# Patient Record
Sex: Female | Born: 1985 | Race: White | Hispanic: No | Marital: Single | Smoking: Never smoker
Health system: Southern US, Community
[De-identification: ages and names within clinical notes are randomized; demographics above are authoritative.]

## PROBLEM LIST (undated history)

## (undated) DIAGNOSIS — F419 Anxiety disorder, unspecified: Secondary | ICD-10-CM

## (undated) DIAGNOSIS — F1910 Other psychoactive substance abuse, uncomplicated: Secondary | ICD-10-CM

## (undated) DIAGNOSIS — G43909 Migraine, unspecified, not intractable, without status migrainosus: Secondary | ICD-10-CM

## (undated) HISTORY — PX: TONSILLECTOMY AND ADENOIDECTOMY: SUR1326

## (undated) HISTORY — DX: Migraine, unspecified, not intractable, without status migrainosus: G43.909

## (undated) HISTORY — DX: Anxiety disorder, unspecified: F41.9

## (undated) HISTORY — DX: Other psychoactive substance abuse, uncomplicated: F19.10

---

## 2012-12-01 ENCOUNTER — Ambulatory Visit (INDEPENDENT_AMBULATORY_CARE_PROVIDER_SITE_OTHER): Payer: Managed Care, Other (non HMO) | Admitting: Family Medicine

## 2012-12-01 VITALS — BP 116/85 | HR 93 | Temp 98.9°F | Resp 24 | Ht 68.0 in | Wt 147.0 lb

## 2012-12-01 DIAGNOSIS — F410 Panic disorder [episodic paroxysmal anxiety] without agoraphobia: Secondary | ICD-10-CM

## 2012-12-01 DIAGNOSIS — F1290 Cannabis use, unspecified, uncomplicated: Secondary | ICD-10-CM

## 2012-12-01 DIAGNOSIS — F411 Generalized anxiety disorder: Secondary | ICD-10-CM

## 2012-12-01 DIAGNOSIS — Z8669 Personal history of other diseases of the nervous system and sense organs: Secondary | ICD-10-CM

## 2012-12-01 LAB — TSH: TSH: 0.97 u[IU]/mL (ref 0.350–4.500)

## 2012-12-01 MED ORDER — ALPRAZOLAM 0.25 MG PO TABS: 0.2500 mg | ORAL_TABLET | Freq: Two times a day (BID) | ORAL | Status: AC | PRN

## 2012-12-01 MED ORDER — ALPRAZOLAM 0.25 MG PO TABS
0.2500 mg | ORAL_TABLET | Freq: Once | ORAL | Status: AC
  Administered 2012-12-01: 0.25 mg via ORAL

## 2012-12-01 NOTE — Progress Notes (Signed)
Subjective:    Patient ID: Veronica Bonilla, female    DOB: [birth year], 27 y.o.   MRN: [id number]  HPI Veronica Bonilla is a 27 y.o. female   Started 3-4 months ago on Zonegran for migraine HA- followed by HA clinic at [facility].   Feels like having more panic attacks recently. Having fixated thoughts that something is really wrong with her or her dog.  Had feeling that was going to die today at around 12:30.  Had smoked marijuana earlier this morning. Prior panic attack around start of Zonegran - but had smoked marijuana then. Has had increased anxiety since starting Zonegran.  Less headaches with Zonegran, but more anxious now. 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  Denies recent depression/SI, more anxiety.  Obsessive tendencies, and picks at skin at times.  Prior on xanax or valium in college - no recent Rx. Still feels on edge in room, but started more sever few hours ago.     SH: Smokes marijuana almost everyday - about 1/2 a gram.  No recent increases in marijuana - has actually decreased. No other IDU. Alcohol - 1-2 drinks twice per week. 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 Exercise - walking for 20 mins per day.  Same sex relationship - Veronica Bonilla, engaged since July. 2 dogs at home. No new stressors at home, feels safe at home.     Review of Systems  Constitutional: Positive for unexpected weight change (45 pound weight loss intentionally past 10 months with diet tracking. denies hx of eating disorder. ).  Genitourinary: Negative for menstrual problem (menses q month. ).       Objective:   Physical Exam  Vitals reviewed. Constitutional: She is oriented to person, place, and time. She appears well-developed and well-nourished.  HENT:  Head: Normocephalic and atraumatic.  Eyes: Conjunctivae and EOM are normal. Pupils are equal, round, and reactive to light.  Neck: Carotid bruit is not present.  Cardiovascular: Normal rate, regular  rhythm, normal heart sounds and intact distal pulses.   Pulmonary/Chest: Effort normal and breath sounds normal.  Abdominal: Soft. She exhibits no pulsatile midline mass. There is no tenderness.  Neurological: She is alert and oriented to person, place, and time.  Skin: Skin is warm and dry.  Psychiatric: Her speech is normal and behavior is normal. Judgment and thought content normal. Her mood appears anxious. Thought content is not paranoid. Cognition and memory are normal. She expresses no homicidal and no suicidal ideation.       Assessment & Plan:  WRIGLEY WINBORNE is a 27 y.o. female Anxiety state, unspecified - Plan: ALPRAZolam (XANAX) tablet 0.25 mg, TSH, ALPRAZolam (XANAX) 0.25 MG tablet  Marijuana use  Hx of migraine headaches  Panic attacks - Plan: ALPRAZolam (XANAX) tablet 0.25 mg, TSH, ALPRAZolam (XANAX) 0.25 MG tablet   Underlying anxiety with panic attacks - suspect multifactorial with substance induced mood disorder (marijuana), new medication - Zonegran, and underlying anxiety/obsessive thoughts. Stop marijuana use, meet with cousleor for CBT/coping techniques, and Xanax prn for anxiety/panic attacks - short term use discussed, and would consider SSRi or change from Zonegran if not improving, but this has been helping her headaches. Understanding expressed. RTC/ER precautions. Will check TSH, but intentional wt loss, and denied disordered eating.   Meds ordered this encounter  Medications  . zonisamide (ZONEGRAN) 100 MG capsule    Sig: Take 100 mg by mouth at bedtime.   Marland Kitchen MELATONIN PO  Sig: Take by mouth at bedtime.  Marland Kitchen MAGNESIUM PO    Sig: Take by mouth at bedtime.  Marland Kitchen acyclovir (ZOVIRAX) 200 MG capsule    Sig: Take 400 mg by mouth daily.  Marland Kitchen ALPRAZolam (XANAX) tablet 0.25 mg    Sig:   . ALPRAZolam (XANAX) 0.25 MG tablet    Sig: Take 1 tablet (0.25 mg total) by mouth 2 (two) times daily as needed for anxiety.    Dispense:  15 tablet    Refill:  0   Patient  Instructions  Stop use of marijuana as this may be contributing to your symptoms.  Call one of the counselors below for an appointment.  If needed, can take xanax up to twice per day.  It is written for 1 tablet, but can take 2nd one in 15 to 30 minutes if no relief of panic attack. Recheck in next 4-6 weeks,and advise your neurologist of this plan. Return to the clinic or go to the nearest emergency room if any of your symptoms worsen or new symptoms occur.  Alan Ripper Huprich: [phone] Verlan Friends: [phone].

## 2012-12-01 NOTE — Patient Instructions (Addendum)
Stop use of marijuana as this may be contributing to your symptoms.  Call one of the counselors below for an appointment.  If needed, can take xanax up to twice per day.  It is written for 1 tablet, but can take 2nd one in 15 to 30 minutes if no relief of panic attack. Recheck in next 4-6 weeks,and advise your neurologist of this plan. Return to the clinic or go to the nearest emergency room if any of your symptoms worsen or new symptoms occur.  Alan Ripper Huprich: [phone] Verlan Friends: [phone].

## 2012-12-19 ENCOUNTER — Ambulatory Visit (INDEPENDENT_AMBULATORY_CARE_PROVIDER_SITE_OTHER): Payer: Managed Care, Other (non HMO) | Admitting: Family Medicine

## 2012-12-19 ENCOUNTER — Ambulatory Visit: Payer: Managed Care, Other (non HMO)

## 2012-12-19 ENCOUNTER — Telehealth: Payer: Self-pay | Admitting: *Deleted

## 2012-12-19 VITALS — BP 128/82 | HR 120 | Temp 97.8°F | Resp 18 | Ht 67.75 in | Wt 143.6 lb

## 2012-12-19 DIAGNOSIS — R0602 Shortness of breath: Secondary | ICD-10-CM

## 2012-12-19 DIAGNOSIS — F411 Generalized anxiety disorder: Secondary | ICD-10-CM | POA: Insufficient documentation

## 2012-12-19 DIAGNOSIS — R05 Cough: Secondary | ICD-10-CM

## 2012-12-19 LAB — POCT CBC
HCT, POC: 39.3 % (ref 37.7–47.9)
Hemoglobin: 12.2 g/dL (ref 12.2–16.2)
MCH, POC: 28.2 pg (ref 27–31.2)
MCV: 90.9 fL (ref 80–97)
MPV: 10.2 fL (ref 0–99.8)
POC MID %: 7.8 %M (ref 0–12)
RBC: 4.32 M/uL (ref 4.04–5.48)
WBC: 6.7 10*3/uL (ref 4.6–10.2)

## 2012-12-19 MED ORDER — CEFDINIR 300 MG PO CAPS: 300.0000 mg | ORAL_CAPSULE | Freq: Two times a day (BID) | ORAL | Status: AC

## 2012-12-19 MED ORDER — BENZONATATE 100 MG PO CAPS: 100.0000 mg | ORAL_CAPSULE | Freq: Three times a day (TID) | ORAL | Status: AC | PRN

## 2012-12-19 NOTE — Telephone Encounter (Signed)
As per Dr. Patsy Lager, I called patient told her blood test (Ddimer) was negative.  Patient was glad to hear that. Patient to call or return to office if she doesn't improve.  Angie Lakeem Rozo, CMA

## 2012-12-19 NOTE — Patient Instructions (Addendum)
I will give you a call when I get your D dimer result back. Assuming it is negative we will treat you for bronchitis with the omnicef (antbiotic) and the tessalon (for cough).  If the D dimer is positive I will arrange for a CT of your chest to rule- out a pulmonary embolism.

## 2012-12-19 NOTE — Progress Notes (Signed)
[facility] [address] [place] [address] [phone]  Date:  12/19/2012   Name:  Veronica Bonilla   DOB:  03-23-1986   MRN:  [id number]  PCP:  No primary provider on file.    Chief Complaint: Cough   History of Present Illness:  Veronica Bonilla is a 27 y.o. very pleasant female patient who presents with the following:  She notes illness since this past Friday- today is Wednesday.  She has a bad cough.  She is coughing up some material- no blood.  She is using mucinex DM which helps some.  She did have a temperature in the 99 degree range at first, but this has resolved.    She does note some runny/ stuffy nose, but this has gotten better.  The symptoms are now more in her lungs.  She did have a ST but this has gotten better as well  The cough is painful.  She has pain in her chest when she coughs, or when she exhales forcefully.  No other CP.  No CP when sitting at rest.  She is also sore in the muscles of her upper back from coughing.  No history of heart problems or family hx of early CAD.    No GI symptoms.  She is otherwise generally healthy. The cough seems to be causing a HA.   She has her menses now.  She is SA with a female partner so there is no change of pregnancy per her report.    She does use MJ but does not smoke otherwise.  She has never had DVT or PE, is not on OCP/ hormones and has not been immobilized recently. She did travel to [facility] last week, but took the direct flight which is brief.    She was seen here a couple of weeks ago for anxiety and was started on PRN xanax. She uses zonegran for migraine HA.    There are no active problems to display for this patient.   Past Medical History  Diagnosis Date  . Substance abuse   . Anxiety   . Migraine     Past Surgical History  Procedure Laterality Date  . Tonsillectomy and adenoidectomy      History  Substance Use Topics  . Smoking status: Never Smoker   . Smokeless tobacco:  Not on file  . Alcohol Use: .5 - 1 oz/week    1-2 drink(s) per week    Family History  Problem Relation Age of Onset  . Anxiety disorder Mother   . Hypertension Mother   . Hypertension Father   . Deep vein thrombosis Father   . Migraines Brother   . Breast cancer Maternal Grandmother   . Lung cancer Maternal Grandfather   . Cancer Paternal Grandmother     No Known Allergies  Medication list has been reviewed and updated.  Current Outpatient Prescriptions on File Prior to Visit  Medication Sig Dispense Refill  . acyclovir (ZOVIRAX) 200 MG capsule Take 400 mg by mouth daily.      Marland Kitchen ALPRAZolam (XANAX) 0.25 MG tablet Take 1 tablet (0.25 mg total) by mouth 2 (two) times daily as needed for anxiety.  15 tablet  0  . MAGNESIUM PO Take by mouth at bedtime.      Marland Kitchen MELATONIN PO Take by mouth at bedtime.      Marland Kitchen zonisamide (ZONEGRAN) 100 MG capsule Take 100 mg by mouth at bedtime.  No current facility-administered medications on file prior to visit.    Review of Systems:  As per HPI- otherwise negative.   Physical Examination: Filed Vitals:   12/19/12 1041  BP: 128/82  Pulse: 124  Temp: 97.8 F (36.6 C)  Resp: 18   Filed Vitals:   12/19/12 1041  Height: 5' 7.75" (1.721 m)  Weight: 143 lb 9.6 oz (65.137 kg)   Body mass index is 21.99 kg/(m^2). Ideal Body Weight: Weight in (lb) to have BMI = 25: 162.9  GEN: WDWN, NAD, Non-toxic, A & O x 3 HEENT: Atraumatic, Normocephalic. Neck supple. No masses, No LAD.  Bilateral TM wnl, oropharynx normal.  PEERL,EOMI.   Ears and Nose: No external deformity. CV: RRR- mild tachycardia, No M/G/R. No JVD. No thrill. No extra heart sounds. PULM: CTA B, no wheezes, crackles, rhonchi. No retractions. No resp. distress. No accessory muscle use. ABD: S, NT, ND, +BS. No rebound. No HSM. EXTR: No c/c/e NEURO Normal gait.  PSYCH: Normally interactive. Conversant. Not depressed or anxious appearing.  Calm demeanor.   No calf pain or  tenderness Tachycardia persists on recheck pulse  [facility] reading (PRIMARY) by  Dr. Patsy Lager. CXR:  No pneumonia, no effusion, cardiac size normal.    CHEST - 2 VIEW  Comparison: None.  Findings: Lungs clear. Heart size and pulmonary vascularity are normal. No adenopathy. There is upper thoracic dextroscoliosis.  IMPRESSION: No edema or consolidation.   Results for orders placed in visit on 12/19/12  POCT CBC      Result Value Range   WBC 6.7  4.6 - 10.2 K/uL   Lymph, poc 1.6  0.6 - 3.4   POC LYMPH PERCENT 23.8  10 - 50 %L   MID (cbc) 0.5  0 - 0.9   POC MID % 7.8  0 - 12 %M   POC Granulocyte 4.6  2 - 6.9   Granulocyte percent 68.4  37 - 80 %G   RBC 4.32  4.04 - 5.48 M/uL   Hemoglobin 12.2  12.2 - 16.2 g/dL   HCT, POC 16.1  09.6 - 47.9 %   MCV 90.9  80 - 97 fL   MCH, POC 28.2  27 - 31.2 pg   MCHC 31.0 (*) 31.8 - 35.4 g/dL   RDW, POC 04.5     Platelet Count, POC 250  142 - 424 K/uL   MPV 10.2  0 - 99.8 fL  POCT URINE PREGNANCY      Result Value Range   Preg Test, Ur Negative      Assessment and Plan: Cough - Plan: POCT CBC, DG Chest 2 View, benzonatate (TESSALON) 100 MG capsule, cefdinir (OMNICEF) 300 MG capsule  SOB (shortness of breath) - Plan: POCT urine pregnancy, D-dimer, quantitative, DG Chest 2 View  See pt instructions-   Signed Abbe Amsterdam, MD  Received D dimer- negative.  Pt alerted to results.   D-Dimer, Quant  0.27   0.00 - 0.48 ug/mL-FEU  Let her know D dimer negative so PE very unlikely.  Plan to treat with antibiotics for bronchitis and tessalon for cough.  She will let us know if not better in the next few days- Sooner if worse.

## 2014-09-04 IMAGING — CR DG CHEST 2V
2 series · 2 of 2 positions shown · non-contrast
Comparison: None.

CLINICAL DATA: Cough and shortness of breath

CHEST - 2 VIEW

[PA]
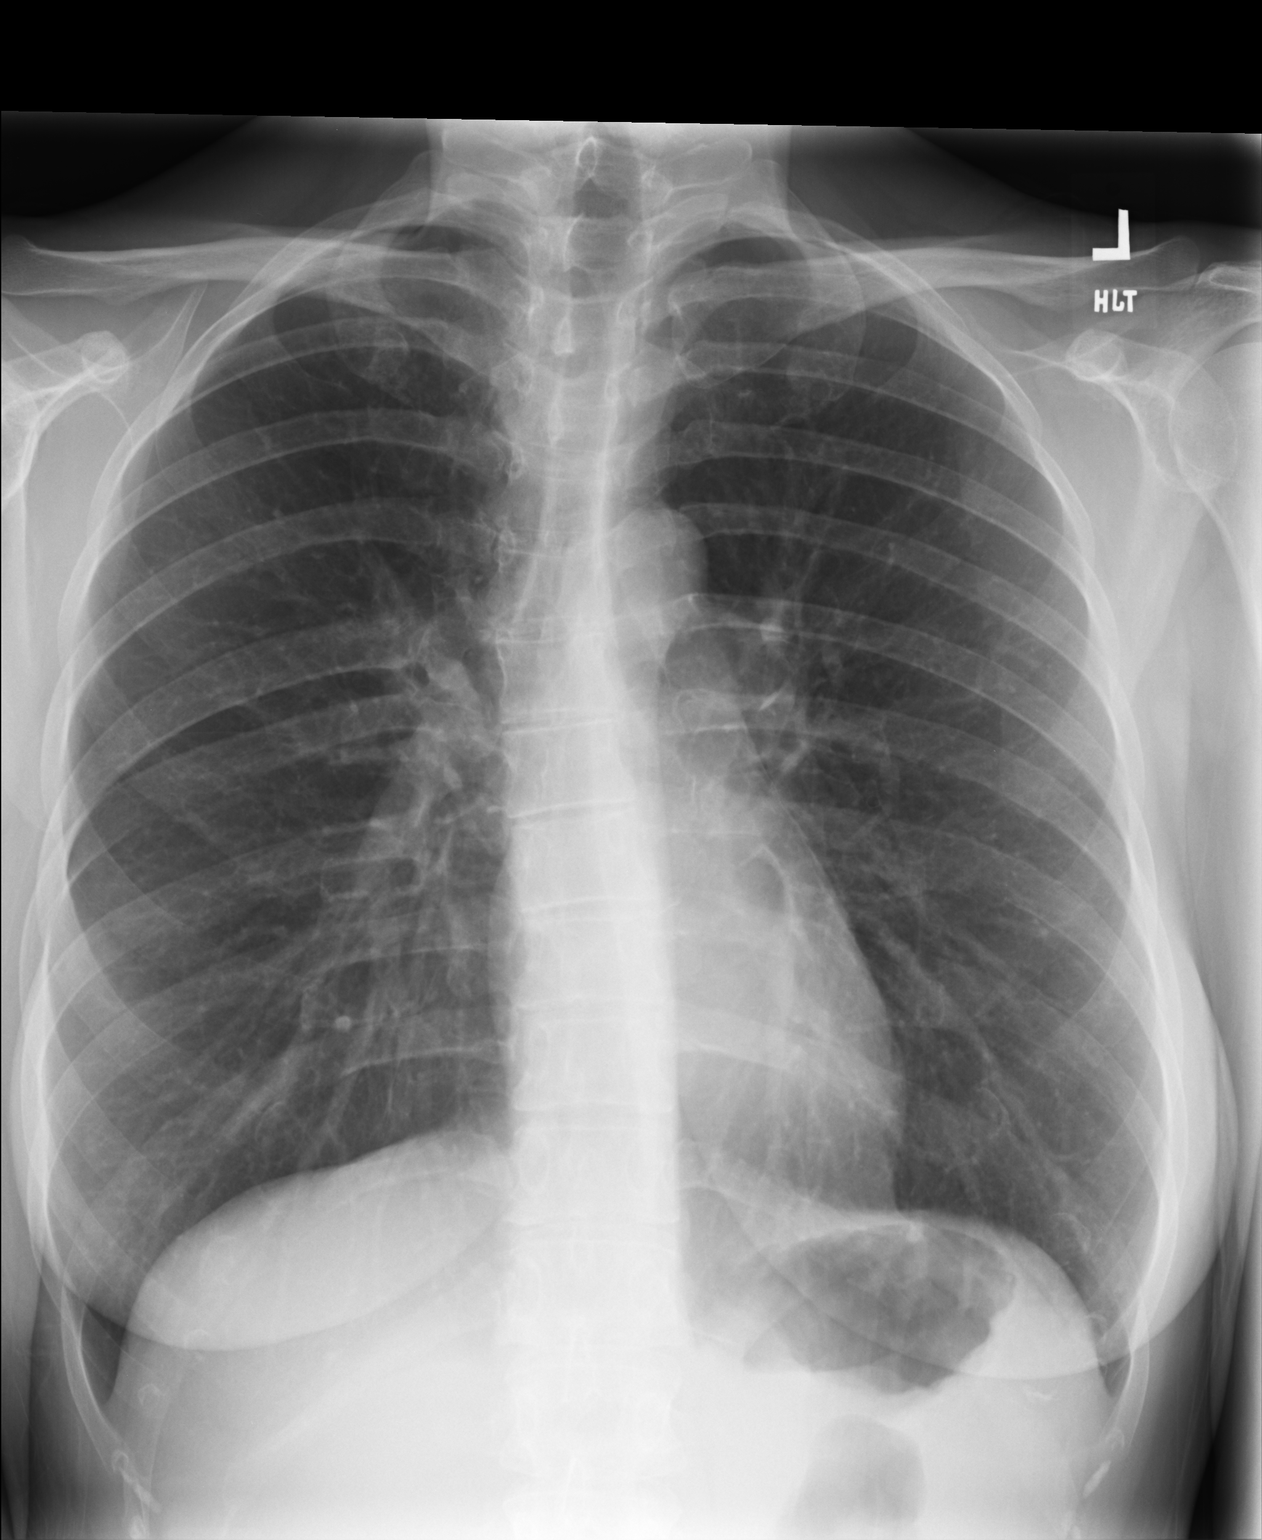

[lateral]
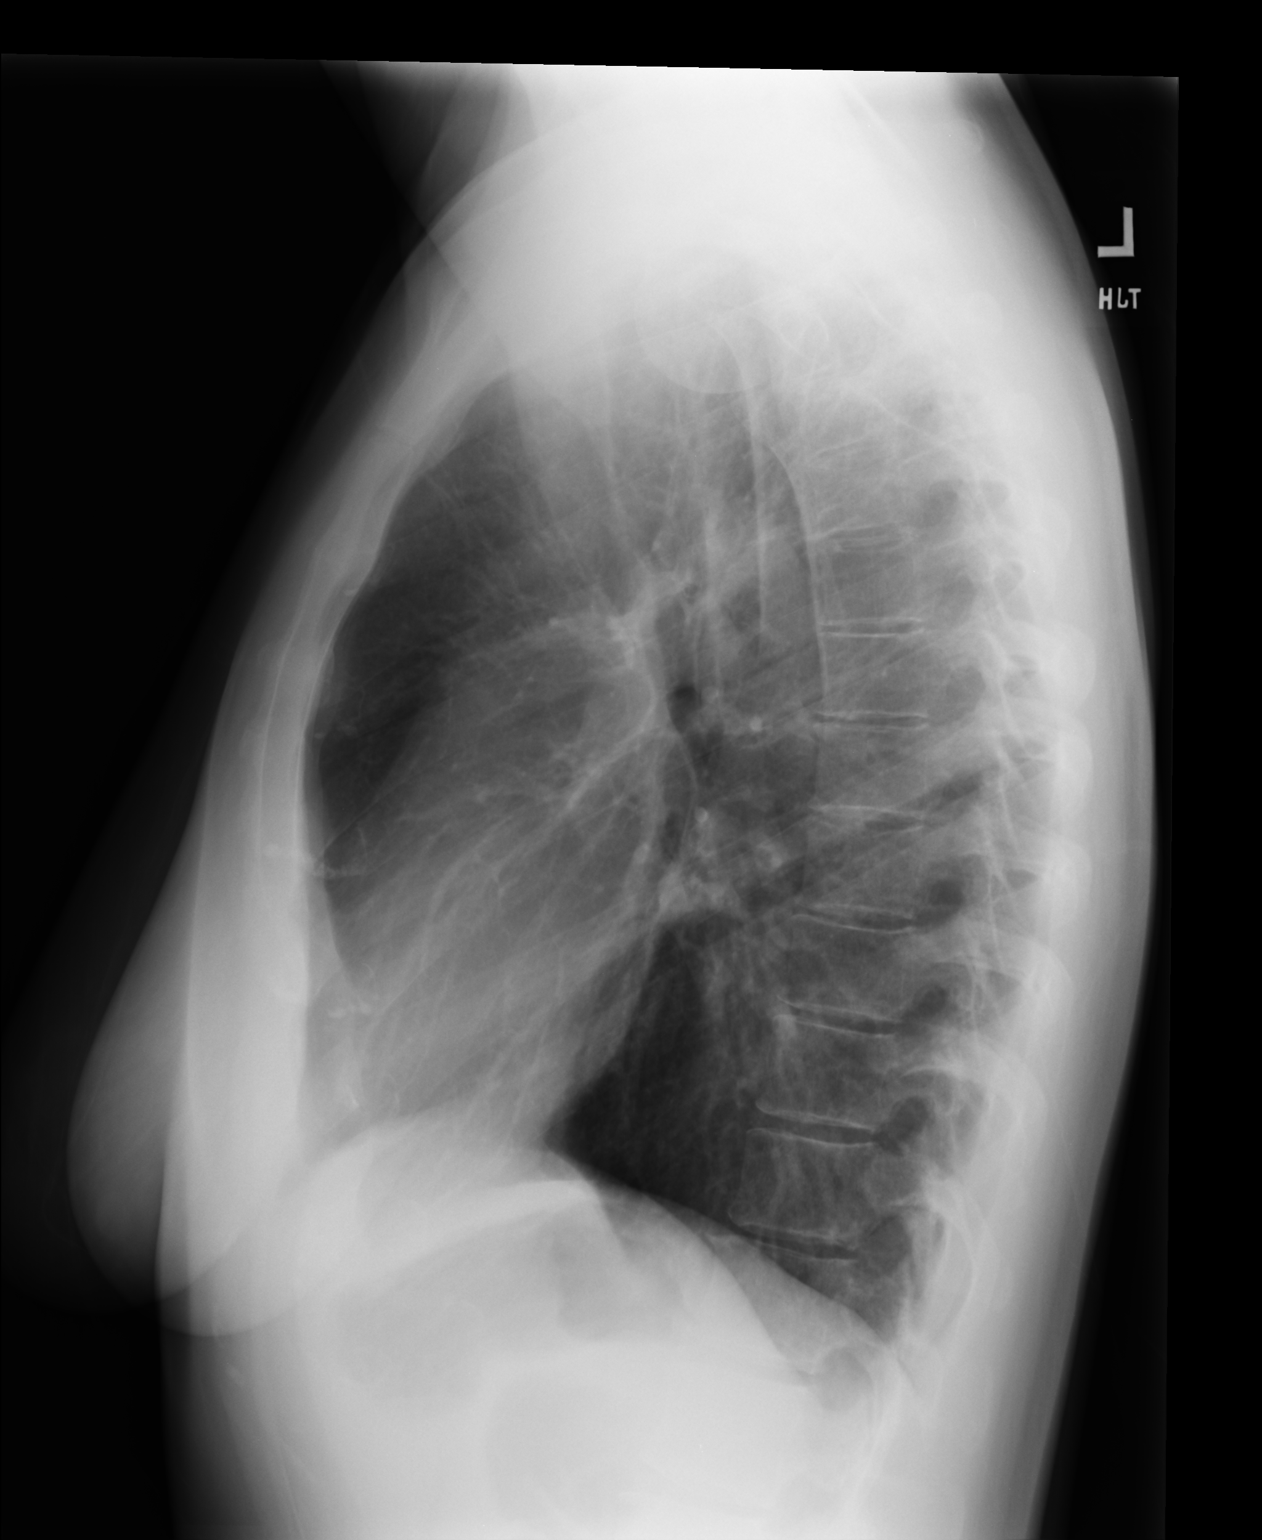

[2 of 2 positions shown; findings below may reference images not displayed]

FINDINGS: Lungs clear.  Heart size and pulmonary vascularity are
normal.  No adenopathy.  There is upper thoracic dextroscoliosis.
IMPRESSION: No edema or consolidation.

## 2017-04-19 ENCOUNTER — Emergency Department (HOSPITAL_COMMUNITY)
Admission: EM | Admit: 2017-04-19 | Discharge: 2017-04-20 | Disposition: A | Discharge status: Home / Self Care | Payer: 59 | Attending: Emergency Medicine | Admitting: Emergency Medicine

## 2017-04-19 ENCOUNTER — Encounter (HOSPITAL_COMMUNITY): Payer: Self-pay

## 2017-04-19 DIAGNOSIS — Z79899 Other long term (current) drug therapy: Secondary | ICD-10-CM | POA: Diagnosis not present

## 2017-04-19 DIAGNOSIS — R1110 Vomiting, unspecified: Secondary | ICD-10-CM | POA: Insufficient documentation

## 2017-04-19 DIAGNOSIS — F19920 Other psychoactive substance use, unspecified with intoxication, uncomplicated: Secondary | ICD-10-CM | POA: Diagnosis present

## 2017-04-19 LAB — RAPID URINE DRUG SCREEN, HOSP PERFORMED
Amphetamines: POSITIVE — AB
BARBITURATES: NOT DETECTED
BENZODIAZEPINES: NOT DETECTED
COCAINE: NOT DETECTED
OPIATES: NOT DETECTED
TETRAHYDROCANNABINOL: POSITIVE — AB

## 2017-04-19 LAB — CBC WITH DIFFERENTIAL/PLATELET
Basophils Absolute: 0 10*3/uL (ref 0.0–0.1)
Basophils Relative: 0 %
EOS PCT: 0 %
Eosinophils Absolute: 0 10*3/uL (ref 0.0–0.7)
HEMATOCRIT: 36.3 % (ref 36.0–46.0)
Hemoglobin: 12.5 g/dL (ref 12.0–15.0)
LYMPHS PCT: 7 %
Lymphs Abs: 0.8 10*3/uL (ref 0.7–4.0)
MCH: 30 pg (ref 26.0–34.0)
MCHC: 34.4 g/dL (ref 30.0–36.0)
MCV: 87.3 fL (ref 78.0–100.0)
MONO ABS: 0.8 10*3/uL (ref 0.1–1.0)
MONOS PCT: 8 %
NEUTROS ABS: 8.9 10*3/uL — AB (ref 1.7–7.7)
Neutrophils Relative %: 85 %
PLATELETS: 245 10*3/uL (ref 150–400)
RBC: 4.16 MIL/uL (ref 3.87–5.11)
RDW: 12.2 % (ref 11.5–15.5)
WBC: 10.4 10*3/uL (ref 4.0–10.5)

## 2017-04-19 LAB — COMPREHENSIVE METABOLIC PANEL
ALT: 36 U/L (ref 14–54)
AST: 33 U/L (ref 15–41)
Albumin: 3.8 g/dL (ref 3.5–5.0)
Alkaline Phosphatase: 29 U/L — ABNORMAL LOW (ref 38–126)
Anion gap: 10 (ref 5–15)
BUN: 11 mg/dL (ref 6–20)
CHLORIDE: 105 mmol/L (ref 101–111)
CO2: 24 mmol/L (ref 22–32)
CREATININE: 0.79 mg/dL (ref 0.44–1.00)
Calcium: 8.4 mg/dL — ABNORMAL LOW (ref 8.9–10.3)
GFR calc non Af Amer: >60 mL/min (ref >60)
Glucose, Bld: 125 mg/dL — ABNORMAL HIGH (ref 65–99)
Potassium: 3.4 mmol/L — ABNORMAL LOW (ref 3.5–5.1)
SODIUM: 139 mmol/L (ref 135–145)
Total Bilirubin: 0.7 mg/dL (ref 0.3–1.2)
Total Protein: 6.7 g/dL (ref 6.5–8.1)

## 2017-04-19 LAB — CBG MONITORING, ED: GLUCOSE-CAPILLARY: 105 mg/dL — AB (ref 65–99)

## 2017-04-19 LAB — SALICYLATE LEVEL

## 2017-04-19 LAB — ACETAMINOPHEN LEVEL

## 2017-04-19 LAB — ETHANOL: Alcohol, Ethyl (B): <5 mg/dL (ref <5)

## 2017-04-19 MED ORDER — ONDANSETRON HCL 4 MG/2ML IJ SOLN
4.0000 mg | Freq: Once | INTRAMUSCULAR | Status: AC
  Administered 2017-04-19: 4 mg via INTRAVENOUS
  Filled 2017-04-19: qty 2

## 2017-04-19 MED ORDER — LORAZEPAM 2 MG/ML IJ SOLN
0.5000 mg | Freq: Once | INTRAMUSCULAR | Status: AC
  Administered 2017-04-19: 0.5 mg via INTRAVENOUS
  Filled 2017-04-19: qty 1

## 2017-04-19 MED ORDER — SODIUM CHLORIDE 0.9 % IV SOLN
INTRAVENOUS | Status: DC
  Administered 2017-04-19: 21:00:00 via INTRAVENOUS

## 2017-04-19 MED ORDER — SODIUM CHLORIDE 0.9 % IV BOLUS (SEPSIS)
1000.0000 mL | Freq: Once | INTRAVENOUS | Status: AC
  Administered 2017-04-19: 1000 mL via INTRAVENOUS

## 2017-04-19 NOTE — ED Notes (Signed)
EKG given to EDP,Yelverton,MD., for review. 

## 2017-04-19 NOTE — ED Notes (Signed)
Pt is alert and orinted x 4 and is verbally responsive.Pt presents with Nausea, and gross body tremors.

## 2017-04-19 NOTE — ED Provider Notes (Signed)
[facility]-EMERGENCY DEPT Provider Note   CSN: [id number] Arrival date & time: 04/19/17  1951     History   Chief Complaint Chief Complaint  Patient presents with  . Drug Overdose    HPI Veronica Bonilla is a 31 y.o. female.  HPI  Veronica Bonilla is a 31 y.o. femalewith history of polysubstance abuse anxiety, migraine headaches, presents to emergency department with complaint of feeling dizzy, nauseated, "fucked up."patient states that she uses kratum and phenibut is recreational drugs. She states that she took some today, she states she normally takes about 10 g of kratum, however today used abouot 10g. She states she normally swallows it. She states she has used in the past as much as 15 g but has not had similar symptoms. She also reports taking 75 mg of Benadryl about the same time and normal goes of her phenibut. She denies taking it as an attempt to harm herself. She states she uses it for recreation. She denies any other drugs or alcohol. She denies any other medical problems.  Past Medical History:  Diagnosis Date  . Anxiety   . Migraine   . Substance abuse     Patient Active Problem List   Diagnosis Date Noted  . Anxiety state, unspecified 12/19/2012    Past Surgical History:  Procedure Laterality Date  . TONSILLECTOMY AND ADENOIDECTOMY      OB History    No data available       Home Medications    Prior to Admission medications   Medication Sig Start Date End Date Taking? Authorizing Provider  acyclovir (ZOVIRAX) 200 MG capsule Take 400 mg by mouth daily.   Yes [provider]  cetirizine (ZYRTEC ALLERGY) 10 MG tablet Take 10 mg by mouth at bedtime.   Yes [provider]  MAGNESIUM PO Take 1 tablet by mouth at bedtime.    Yes [provider]  MELATONIN PO Take 1 tablet by mouth at bedtime.    Yes [provider]  SUMAtriptan (IMITREX) 100 MG tablet TAKE ONE TABLET BY MOUTH AT ONSET OF HEADACHE; MAY REPEAT ONE TABLET IN 2  HOURS AS NEEDED. 04/06/17  Yes [provider]  ALPRAZolam (XANAX) 0.25 MG tablet Take 1 tablet (0.25 mg total) by mouth 2 (two) times daily as needed for anxiety. Patient not taking: Reported on 04/19/2017 12/01/12   Shade Flood, MD  benzonatate (TESSALON) 100 MG capsule Take 1 capsule (100 mg total) by mouth 3 (three) times daily as needed for cough. Patient not taking: Reported on 04/19/2017 12/19/12   Copland, Gwenlyn Found, MD  cefdinir (OMNICEF) 300 MG capsule Take 1 capsule (300 mg total) by mouth 2 (two) times daily. Patient not taking: Reported on 04/19/2017 12/19/12   Copland, Gwenlyn Found, MD    Family History Family History  Problem Relation Age of Onset  . Anxiety disorder Mother   . Hypertension Mother   . Hypertension Father   . Deep vein thrombosis Father   . Migraines Brother   . Breast cancer Maternal Grandmother   . Lung cancer Maternal Grandfather   . Cancer Paternal Grandmother     Social History Social History  Substance Use Topics  . Smoking status: Never Smoker  . Smokeless tobacco: Not on file  . Alcohol use 0.5 - 1.0 oz/week    1 - 2 Standard drinks or equivalent per week     Allergies   Patient has no known allergies.   Review of Systems Review  of Systems  Constitutional: Negative for chills and fever.  Respiratory: Negative for cough, chest tightness and shortness of breath.   Cardiovascular: Negative for chest pain, palpitations and leg swelling.  Gastrointestinal: Positive for nausea. Negative for abdominal pain, diarrhea and vomiting.  Genitourinary: Negative for dysuria, flank pain, pelvic pain, vaginal bleeding, vaginal discharge and vaginal pain.  Musculoskeletal: Negative for arthralgias, myalgias, neck pain and neck stiffness.  Skin: Negative for rash.  Neurological: Positive for dizziness and light-headedness. Negative for weakness and headaches.  Psychiatric/Behavioral: Negative for hallucinations, self-injury and suicidal ideas.  The patient is nervous/anxious.   All other systems reviewed and are negative.    Physical Exam Updated Vital Signs BP (!) 133/100 (BP Location: Right Arm)   Pulse (!) 107   Temp (!) 97.5 F (36.4 C) (Oral)   Resp 20   SpO2 100%   Physical Exam  Constitutional: She is oriented to person, place, and time. She appears well-developed and well-nourished. No distress.  HENT:  Head: Normocephalic.  Eyes: Pupils are equal, round, and reactive to light. Conjunctivae are normal.  pupils are 2 mm and equal bilaterally. Nystagmus was present  Neck: Normal range of motion. Neck supple.  Cardiovascular: Regular rhythm and normal heart sounds.   tachycardic  Pulmonary/Chest: Effort normal and breath sounds normal. No respiratory distress. She has no wheezes. She has no rales.  Abdominal: Soft. There is no tenderness.  Musculoskeletal: She exhibits no edema.  Neurological: She is alert and oriented to person, place, and time.  Skin: Skin is warm and dry.  Psychiatric: She has a normal mood and affect. Her behavior is normal.  Nursing note and vitals reviewed.    ED Treatments / Results  Labs (all labs ordered are listed, but only abnormal results are displayed) Labs Reviewed  COMPREHENSIVE METABOLIC PANEL - Abnormal; Notable for the following:       Result Value   Potassium 3.4 (*)    Glucose, Bld 125 (*)    Calcium 8.4 (*)    Alkaline Phosphatase 29 (*)    All other components within normal limits  ACETAMINOPHEN LEVEL - Abnormal; Notable for the following:    Acetaminophen (Tylenol), Serum <10 (*)    All other components within normal limits  RAPID URINE DRUG SCREEN, HOSP PERFORMED - Abnormal; Notable for the following:    Amphetamines POSITIVE (*)    Tetrahydrocannabinol POSITIVE (*)    All other components within normal limits  CBC WITH DIFFERENTIAL/PLATELET - Abnormal; Notable for the following:    Neutro Abs 8.9 (*)    All other components within normal limits  CBG  MONITORING, ED - Abnormal; Notable for the following:    Glucose-Capillary 105 (*)    All other components within normal limits  SALICYLATE LEVEL  ETHANOL  I-STAT BETA HCG BLOOD, ED ([facility], [facility], [facility] ONLY)    EKG  EKG Interpretation  Date/Time:  Wednesday April 19 2017 20:46:25 EDT Ventricular Rate:  97 PR Interval:    QRS Duration: 120 QT Interval:  392 QTC Calculation: 498 R Axis:   77 Text Interpretation:  Sinus rhythm Nonspecific intraventricular conduction delay No previous tracing Confirmed by Gilda Crease [id number] on 04/20/2017 12:35:29 AM       Radiology No results found.  Procedures Procedures (including critical care time)  Medications Ordered in ED Medications  sodium chloride 0.9 % bolus 1,000 mL (1,000 mLs Intravenous New Bag/Given 04/19/17 2037)    And  0.9 %  sodium chloride infusion (not administered)  ondansetron (ZOFRAN) injection 4 mg (not administered)  LORazepam (ATIVAN) injection 0.5 mg (not administered)     Initial Impression / Assessment and Plan / ED Course  I have reviewed the triage vital signs and the nursing notes.  Pertinent labs & imaging results that were available during my care of the patient were reviewed by me and considered in my medical decision making (see chart for details).     Patient seen and examined. Patient with nausea, vomiting, dizziness after taking  Kratum, benadryl, phenibut. She is alert, oriented. Tachycardic otherwise normal VS. rN spoke with poison control who recommended just supportive treatment, monitor EKG abnormalities, basic labs, Tylenol and salicylate levels. Labs and fluids ordered. Will give zofran and ativan for anxiety and nausea.   11:24 PM Patient is feeling better. Heart rate is down in the 90s. Nausea improved she is asking for some water. Will provide water, will continue to monitor. Second liter of fluids ordered.  12:33 AM Patient is feeling better. She is drinking ginger ale. She  ambulated to the bathroom with no difficulty. Plan to discharge home. Discussed stopping using any illicit substances. Follow-up with family doctor as needed.   Vitals:   04/19/17 2007 04/20/17 0000  BP: (!) 133/100 125/87  Pulse: (!) 107 93  Resp: 20 17  Temp: (!) 97.5 F (36.4 C) 98.2 F (36.8 C)  TempSrc: Oral Oral  SpO2: 100% 99%     Final Clinical Impressions(s) / ED Diagnoses   Final diagnoses:  Intoxication by drug, uncomplicated (HCC)    New Prescriptions New Prescriptions   ONDANSETRON (ZOFRAN ODT) 8 MG DISINTEGRATING TABLET    Take 1 tablet (8 mg total) by mouth every 8 (eight) hours as needed for nausea or vomiting.     Jaynie Crumble, PA-C 04/20/17 1610    Loren Racer, MD 04/24/17 [phone]

## 2017-04-19 NOTE — ED Triage Notes (Signed)
Pt found at home called EMS after being passed out on the floor at home as she has overdosed on substance called Phytoextractum pt reports that she took 12 grams of substance. Pt has multiple episodes of emesis en route . Pt denies suicidal Ideal ation, and intent to harm self   BP 127/97 HR 112, RR 16  O2 sat 98%.  18 G left AC.

## 2017-04-20 MED ORDER — ONDANSETRON 8 MG PO TBDP: 8.0000 mg | ORAL_TABLET | Freq: Three times a day (TID) | ORAL | 0 refills | Status: AC | PRN

## 2017-04-20 NOTE — Discharge Instructions (Signed)
Drink plenty of fluids. Follow up with family doctor as needed. Return if worsening symptoms.
